# Patient Record
Sex: Female | Born: 2005 | Race: Black or African American | Hispanic: No | Marital: Single | Smoking: Never smoker
Health system: Southern US, Community
[De-identification: ages and names within clinical notes are randomized; demographics above are authoritative.]

## PROBLEM LIST (undated history)

## (undated) DIAGNOSIS — H6690 Otitis media, unspecified, unspecified ear: Secondary | ICD-10-CM

## (undated) HISTORY — DX: Otitis media, unspecified, unspecified ear: H66.90

## (undated) HISTORY — PX: TYMPANOSTOMY TUBE PLACEMENT: SHX32

---
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
## 2023-11-26 ENCOUNTER — Ambulatory Visit (HOSPITAL_COMMUNITY)
Admission: EM | Admit: 2023-11-26 | Discharge: 2023-11-27 | Disposition: A | Discharge status: Home / Self Care | Attending: Urology | Admitting: Urology

## 2023-11-26 DIAGNOSIS — F640 Transsexualism: Secondary | ICD-10-CM | POA: Insufficient documentation

## 2023-11-26 DIAGNOSIS — R45851 Suicidal ideations: Secondary | ICD-10-CM | POA: Diagnosis present

## 2023-11-26 DIAGNOSIS — F332 Major depressive disorder, recurrent severe without psychotic features: Secondary | ICD-10-CM | POA: Insufficient documentation

## 2023-11-26 DIAGNOSIS — F4323 Adjustment disorder with mixed anxiety and depressed mood: Secondary | ICD-10-CM | POA: Insufficient documentation

## 2023-11-26 DIAGNOSIS — F1010 Alcohol abuse, uncomplicated: Secondary | ICD-10-CM | POA: Diagnosis not present

## 2023-11-26 LAB — COMPREHENSIVE METABOLIC PANEL WITH GFR
ALT: 20 U/L (ref 0–44)
AST: 24 U/L (ref 15–41)
Albumin: 4.3 g/dL (ref 3.5–5.0)
Alkaline Phosphatase: 99 U/L (ref 38–126)
Anion gap: 10 (ref 5–15)
BUN: 10 mg/dL (ref 6–20)
CO2: 25 mmol/L (ref 22–32)
Calcium: 9.6 mg/dL (ref 8.9–10.3)
Chloride: 102 mmol/L (ref 98–111)
Creatinine, Ser: 0.99 mg/dL (ref 0.44–1.00)
GFR, Estimated: >60 mL/min (ref >60)
Glucose, Bld: 87 mg/dL (ref 70–99)
Potassium: 4.3 mmol/L (ref 3.5–5.1)
Sodium: 137 mmol/L (ref 135–145)
Total Bilirubin: 0.8 mg/dL (ref 0.0–1.2)
Total Protein: 7.4 g/dL (ref 6.5–8.1)

## 2023-11-26 LAB — CBC WITH DIFFERENTIAL/PLATELET
Abs Immature Granulocytes: 0.02 K/uL (ref 0.00–0.07)
Basophils Absolute: 0.1 K/uL (ref 0.0–0.1)
Basophils Relative: 1 %
Eosinophils Absolute: 0.1 K/uL (ref 0.0–0.5)
Eosinophils Relative: 2 %
HCT: 42.3 % (ref 36.0–46.0)
Hemoglobin: 13.8 g/dL (ref 12.0–15.0)
Immature Granulocytes: 0 %
Lymphocytes Relative: 46 %
Lymphs Abs: 3.3 K/uL (ref 0.7–4.0)
MCH: 30.5 pg (ref 26.0–34.0)
MCHC: 32.6 g/dL (ref 30.0–36.0)
MCV: 93.6 fL (ref 80.0–100.0)
Monocytes Absolute: 0.5 K/uL (ref 0.1–1.0)
Monocytes Relative: 7 %
Neutro Abs: 3.1 K/uL (ref 1.7–7.7)
Neutrophils Relative %: 44 %
Platelets: 326 K/uL (ref 150–400)
RBC: 4.52 MIL/uL (ref 3.87–5.11)
RDW: 12.2 % (ref 11.5–15.5)
WBC: 7 K/uL (ref 4.0–10.5)
nRBC: 0 % (ref 0.0–0.2)

## 2023-11-26 LAB — POCT URINE DRUG SCREEN - MANUAL ENTRY (I-SCREEN)
POC Amphetamine UR: NOT DETECTED
POC Buprenorphine (BUP): NOT DETECTED
POC Cocaine UR: NOT DETECTED
POC Marijuana UR: POSITIVE — AB
POC Methadone UR: NOT DETECTED
POC Methamphetamine UR: NOT DETECTED
POC Morphine: NOT DETECTED
POC Oxazepam (BZO): NOT DETECTED
POC Oxycodone UR: NOT DETECTED
POC Secobarbital (BAR): NOT DETECTED

## 2023-11-26 LAB — LIPID PANEL
Cholesterol: 154 mg/dL (ref 0–200)
HDL: 85 mg/dL (ref >40)
LDL Cholesterol: 60 mg/dL (ref 0–99)
Total CHOL/HDL Ratio: 1.8 ratio
Triglycerides: 44 mg/dL (ref <150)
VLDL: 9 mg/dL (ref 0–40)

## 2023-11-26 LAB — POC URINE PREG, ED: Preg Test, Ur: NEGATIVE

## 2023-11-26 LAB — HEMOGLOBIN A1C
Hgb A1c MFr Bld: 4.5 % — ABNORMAL LOW (ref 4.8–5.6)
Mean Plasma Glucose: 82.45 mg/dL

## 2023-11-26 LAB — TSH: TSH: 4.649 u[IU]/mL — ABNORMAL HIGH (ref 0.350–4.500)

## 2023-11-26 LAB — ETHANOL: Alcohol, Ethyl (B): <15 mg/dL (ref <15)

## 2023-11-26 MED ORDER — HALOPERIDOL LACTATE 5 MG/ML IJ SOLN: 10.0000 mg | Freq: Three times a day (TID) | INTRAMUSCULAR | Status: DC | PRN

## 2023-11-26 MED ORDER — DIPHENHYDRAMINE HCL 50 MG/ML IJ SOLN: 50.0000 mg | Freq: Three times a day (TID) | INTRAMUSCULAR | Status: DC | PRN

## 2023-11-26 MED ORDER — HALOPERIDOL 5 MG PO TABS: 5.0000 mg | ORAL_TABLET | Freq: Three times a day (TID) | ORAL | Status: DC | PRN

## 2023-11-26 MED ORDER — TRAZODONE HCL 50 MG PO TABS: 50.0000 mg | ORAL_TABLET | Freq: Every evening | ORAL | Status: DC | PRN

## 2023-11-26 MED ORDER — ACETAMINOPHEN 325 MG PO TABS: 650.0000 mg | ORAL_TABLET | Freq: Four times a day (QID) | ORAL | Status: DC | PRN

## 2023-11-26 MED ORDER — MAGNESIUM HYDROXIDE 400 MG/5ML PO SUSP: 30.0000 mL | Freq: Every day | ORAL | Status: DC | PRN

## 2023-11-26 MED ORDER — LORAZEPAM 2 MG/ML IJ SOLN: 2.0000 mg | Freq: Three times a day (TID) | INTRAMUSCULAR | Status: DC | PRN

## 2023-11-26 MED ORDER — DIPHENHYDRAMINE HCL 50 MG PO CAPS: 50.0000 mg | ORAL_CAPSULE | Freq: Three times a day (TID) | ORAL | Status: DC | PRN

## 2023-11-26 MED ORDER — ALUM & MAG HYDROXIDE-SIMETH 200-200-20 MG/5ML PO SUSP: 30.0000 mL | ORAL | Status: DC | PRN

## 2023-11-26 MED ORDER — HYDROXYZINE HCL 25 MG PO TABS: 25.0000 mg | ORAL_TABLET | Freq: Three times a day (TID) | ORAL | Status: DC | PRN

## 2023-11-26 MED ORDER — HALOPERIDOL LACTATE 5 MG/ML IJ SOLN: 5.0000 mg | Freq: Three times a day (TID) | INTRAMUSCULAR | Status: DC | PRN
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
## 2023-11-26 NOTE — ED Notes (Signed)
 Patient states he currently is anxious about being able to call out of work. Security contacted to get temporary get numbers from phone. Food/snacks offered. Patient oriented to bathroom and shower.

## 2023-11-26 NOTE — Progress Notes (Signed)
   11/26/23 1733  [facility] Triage Screening (Walk-ins at [facility] only)  How Did You Hear About Us ? Self  What Is the Reason for Your Visit/Call Today? Olivia Myers is a 18 year old female presenting to [facility] unaccompanied. Pt states he is having ongoing suicidal thoughts at this time. Pt reports that he has thought of a plan to end his life by abusing Adderall and Alcohol. Pt reports that he has no past suicide attempts. Pt states that he has been struggling with marijuana use and alcohol since he was 18 years old. Pt states that he has been drinking any chance he gets. Pt denies Hi and AVH. Pts appearance is neat, eye contact is normal, speech is normal, motor activity is normal and affect is full. Pt states he is not seeing a therapist or taking medication at this time. Pt denies having any mental health disorders.  How Long Has This Been Causing You Problems? <Week  Have You Recently Had Any Thoughts About Hurting Yourself? Yes  How long ago did you have thoughts about hurting yourself? today  Are You Planning to Commit Suicide/Harm Yourself At This time? Yes  Have you Recently Had Thoughts About Hurting Someone Sherral? No  Are You Planning To Harm Someone At This Time? No  Physical Abuse Denies  Verbal Abuse Denies  Sexual Abuse Denies  Exploitation of patient/patient's resources Denies  Self-Neglect Denies  Are you currently experiencing any auditory, visual or other hallucinations? No  Have You Used Any Alcohol or Drugs in the Past 24 Hours? Yes  What Did You Use and How Much? marijauna, alcohol  Do you have any current medical co-morbidities that require immediate attention? No  What Do You Feel Would Help You the Most Today? Treatment for Depression or other mood problem  If access to [facility] was not available, would you have sought care in the Emergency Department? No  Determination of Need Urgent (48 hours)  Options For Referral Inpatient Hospitalization  Determination of Need filed? Yes

## 2023-11-27 NOTE — Discharge Instructions (Addendum)
 Placed referral to be established for outpatient psychiatry follow up for medication management. The team will reach out to you regarding availability.  Follow-up recommendations:  Activity:  Normal, as tolerated Diet:  Per PCP recommendation  Patient is instructed prior to discharge to: Take all medications as prescribed by his mental healthcare provider. Report any adverse effects and/or reactions from the medicines to his outpatient provider promptly. Patient has been instructed & cautioned: To not engage in alcohol and or illegal drug use while on prescription medicines.  In the event of worsening symptoms, patient is instructed to call the crisis hotline at 988, 911 and or go to the nearest ED for appropriate evaluation and treatment of symptoms. To follow-up with his primary care provider for your other medical issues, concerns and or health care needs.

## 2023-11-27 NOTE — ED Notes (Signed)
 Stable. A&O x 4.  Discharging to home self care.   Denies current SI plan and Intent.  Denies HI and A/V hallucinations.   All belongings returned to PT.  F/U instruction reviewed   Pt verbalized understanding

## 2023-11-27 NOTE — ED Provider Notes (Signed)
 [facility] Urgent Care Continuous Assessment Admission H&P  Date: 11/27/23 Patient Name: Olivia Myers MRN: [id number] Chief Complaint: suicidal ideation   Diagnoses:  Final diagnoses:  Severe episode of recurrent major depressive disorder, without psychotic features (HCC)    HPI:  Olivia Myers is a 18 y.o. adult transgender female (prefers to be called Olivia Myers) with psychiatric history of gender dysphoria, adjustment disorder with depressed mood, and chronic suicidal ideation, who presented voluntarily to [facility] reporting suicidal ideation with a plan to overdose on Adderall and alcohol.    Patient seen face to face and his chart reviewed by this NP. On assessment, patient reports increased stress related to financial strain and his grandfather's deteriorating health. He says he lives with his grandparents; his grandmother is on disability, and his grandfather was recently diagnosed with early onset dementia. He says his driver's license his suspended and his father takes him to work. He expressed concern that his grandfather's worsening condition may force his grandfather to stop working and driving, further increasing the family's financial hardship. He reported feeling depressed and experiencing suicidal thoughts with plan to overdose on Adderall and alcohol. He denies any prior suicide attempts. He admitted to alcohol abuse, noting that he uses alcohol as a coping mechanism for his depression. He endorsed symptoms of hopelessness, social isolation, impulsivity, anxiety, and poor sleep.   patient is alert and oriented x4, dressed appropriately, and was cooperative during the interview. Eye contact was fair. Speech was clear, coherent and goal-directed. Mood was described as "depressed," and affect was constricted and congruent with mood. Thought processes were logical and organized, and thought content revealed suicidal ideation with a plan. He demonstrated good insight. Judgment is  impaired.   Total Time spent with patient: 30 minutes  Musculoskeletal  Strength & Muscle Tone: within normal limits Gait & Station: normal Patient leans: Right  Psychiatric Specialty Exam  Presentation General Appearance:  Appropriate for Environment  Eye Contact: Good  Speech: Clear and Coherent  Speech Volume: Normal  Handedness: Right   Mood and Affect  Mood: Depressed  Affect: Congruent   Thought Process  Thought Processes: Coherent  Descriptions of Associations:Intact  Orientation:Full (Time, Place and Person)  Thought Content:WDL  Diagnosis of Schizophrenia or Schizoaffective disorder in past: No   Hallucinations:Hallucinations: None  Ideas of Reference:None  Suicidal Thoughts:Suicidal Thoughts: Yes, Active SI Active Intent and/or Plan: With Plan; With Intent  Homicidal Thoughts:Homicidal Thoughts: No   Sensorium  Memory: Immediate Good  Judgment: Good  Insight: Good   Executive Functions  Concentration: Good  Attention Span: Good  Recall: Good  Fund of Knowledge: Good  Language: Good   Psychomotor Activity  Psychomotor Activity: Psychomotor Activity: Normal   Assets  Assets: Communication Skills; Desire for Improvement; Physical Health   Sleep  Sleep: Sleep: Fair Number of Hours of Sleep: 6   Nutritional Assessment (For OBS and FBC admissions only) Has the patient had a weight loss or gain of 10 pounds or more in the last 3 months?: No Has the patient had a decrease in food intake/or appetite?: No Does the patient have dental problems?: No Does the patient have eating habits or behaviors that may be indicators of an eating disorder including binging or inducing vomiting?: No Has the patient recently lost weight without trying?: 0 Has the patient been eating poorly because of a decreased appetite?: 0 Malnutrition Screening Tool Score: 0    Physical Exam Vitals and nursing note reviewed.   Constitutional:  General: He is not in acute distress.    Appearance: He is well-developed.  HENT:     Head: Normocephalic and atraumatic.  Eyes:     Conjunctiva/sclera: Conjunctivae normal.  Cardiovascular:     Rate and Rhythm: Normal rate.  Pulmonary:     Effort: Pulmonary effort is normal.  Abdominal:     Palpations: Abdomen is soft.  Musculoskeletal:        General: Normal range of motion.     Cervical back: Normal range of motion.  Skin:    General: Skin is warm and dry.     Capillary Refill: Capillary refill takes less than 2 seconds.  Neurological:     Mental Status: He is alert and oriented to person, place, and time.  Psychiatric:        Attention and Perception: Attention and perception normal.        Mood and Affect: Mood and affect normal.        Speech: Speech normal.        Behavior: Behavior normal. Behavior is cooperative.        Thought Content: Thought content includes suicidal ideation. Thought content includes suicidal plan.    Review of Systems  Constitutional: Negative.   HENT: Negative.    Eyes: Negative.   Respiratory: Negative.    Cardiovascular: Negative.   Gastrointestinal: Negative.   Genitourinary: Negative.   Musculoskeletal: Negative.   Skin: Negative.   Neurological: Negative.   Endo/Heme/Allergies: Negative.   Psychiatric/Behavioral:  Positive for depression, substance abuse and suicidal ideas.     Blood pressure 135/78, pulse 89, temperature 98.7 F (37.1 C), temperature source Oral, resp. rate 20, SpO2 99%. There is no height or weight on file to calculate BMI.  Past Psychiatric History: gender dysphoria, adjustment disorder with depressed mood, and chronic suicidal ideation   Is the patient at risk to self? Yes  Has the patient been a risk to self in the past 6 months? Yes .    Has the patient been a risk to self within the distant past? Yes   Is the patient a risk to others? No   Has the patient been a risk to others in  the past 6 months? No   Has the patient been a risk to others within the distant past? No   Past Medical History:  Past Medical History:  Diagnosis Date   Otitis media      Family History:  Family History  Problem Relation Age of Onset   Drug abuse Mother    Bipolar disorder Mother    Hyperlipidemia Maternal Grandmother    Diabetes Paternal Grandmother    Lupus Neg Hx      Social History:  Social History   Tobacco Use   Smoking status: Never    Passive exposure: Yes   Smokeless tobacco: Never  Vaping Use   Vaping status: Never Used  Substance Use Topics   Alcohol use: Yes    Comment: Had sips   Drug use: Yes    Types: Marijuana     Last Labs:  Admission on 11/26/2023  Component Date Value Ref Range Status   WBC 11/26/2023 7.0  4.0 - 10.5 K/uL Final   RBC 11/26/2023 4.52  3.87 - 5.11 MIL/uL Final   Hemoglobin 11/26/2023 13.8  12.0 - 15.0 g/dL Final   HCT 89/89/7974 42.3  36.0 - 46.0 % Final   MCV 11/26/2023 93.6  80.0 - 100.0 fL Final   MCH 11/26/2023 30.5  26.0 - 34.0 pg Final   MCHC 11/26/2023 32.6  30.0 - 36.0 g/dL Final   RDW 89/89/7974 12.2  11.5 - 15.5 % Final   Platelets 11/26/2023 326  150 - 400 K/uL Final   nRBC 11/26/2023 0.0  0.0 - 0.2 % Final   Neutrophils Relative % 11/26/2023 44  % Final   Neutro Abs 11/26/2023 3.1  1.7 - 7.7 K/uL Final   Lymphocytes Relative 11/26/2023 46  % Final   Lymphs Abs 11/26/2023 3.3  0.7 - 4.0 K/uL Final   Monocytes Relative 11/26/2023 7  % Final   Monocytes Absolute 11/26/2023 0.5  0.1 - 1.0 K/uL Final   Eosinophils Relative 11/26/2023 2  % Final   Eosinophils Absolute 11/26/2023 0.1  0.0 - 0.5 K/uL Final   Basophils Relative 11/26/2023 1  % Final   Basophils Absolute 11/26/2023 0.1  0.0 - 0.1 K/uL Final   Immature Granulocytes 11/26/2023 0  % Final   Abs Immature Granulocytes 11/26/2023 0.02  0.00 - 0.07 K/uL Final   Performed at [facility], [address], [place], [place] [address]   Hgb A1c MFr Bld  11/26/2023 4.5 (L)  4.8 - 5.6 % Final   Comment: (NOTE) Diagnosis of Diabetes The following HbA1c ranges recommended by the American Diabetes Association (ADA) may be used as an aid in the diagnosis of diabetes mellitus.  Hemoglobin             Suggested A1C NGSP%              Diagnosis  <5.7                   Non Diabetic  5.7-6.4                Pre-Diabetic  >6.4                   Diabetic  <7.0                   Glycemic control for                       adults with diabetes.     Mean Plasma Glucose 11/26/2023 82.45  mg/dL Final   Performed at [facility], [address], [place], [place] [address]   Sodium 11/26/2023 137  135 - 145 mmol/L Final   Potassium 11/26/2023 4.3  3.5 - 5.1 mmol/L Final   Chloride 11/26/2023 102  98 - 111 mmol/L Final   CO2 11/26/2023 25  22 - 32 mmol/L Final   Glucose, Bld 11/26/2023 87  70 - 99 mg/dL Final   Glucose reference range applies only to samples taken after fasting for at least 8 hours.   BUN 11/26/2023 10  6 - 20 mg/dL Final   Creatinine, Ser 11/26/2023 0.99  0.44 - 1.00 mg/dL Final   Calcium 89/89/7974 9.6  8.9 - 10.3 mg/dL Final   Total Protein 89/89/7974 7.4  6.5 - 8.1 g/dL Final   Albumin 89/89/7974 4.3  3.5 - 5.0 g/dL Final   AST 89/89/7974 24  15 - 41 U/L Final   ALT 11/26/2023 20  0 - 44 U/L Final   Alkaline Phosphatase 11/26/2023 99  38 - 126 U/L Final   Total Bilirubin 11/26/2023 0.8  0.0 - 1.2 mg/dL Final   GFR, Estimated 11/26/2023 >60  >60 mL/min Final   Comment: (NOTE) Calculated using the CKD-EPI Creatinine  Equation (2021)    Anion gap 11/26/2023 10  5 - 15 Final   Performed at [facility], [address] [place], [place] [address]   Alcohol, Ethyl (B) 11/26/2023 <15  <15 mg/dL Final   Comment: (NOTE) For medical purposes only. Performed at [facility], [address], [place], [place] [address]    Cholesterol 11/26/2023 154  0 - 200 mg/dL Final   Triglycerides 89/89/7974 44  <150 mg/dL  Final   HDL 89/89/7974 85  >40 mg/dL Final   Total CHOL/HDL Ratio 11/26/2023 1.8  RATIO Final   VLDL 11/26/2023 9  0 - 40 mg/dL Final   LDL Cholesterol 11/26/2023 60  0 - 99 mg/dL Final   Comment:        Total Cholesterol/HDL:CHD Risk Coronary Heart Disease Risk Table                     Men   Women  1/2 Average Risk   3.4   3.3  Average Risk       5.0   4.4  2 X Average Risk   9.6   7.1  3 X Average Risk  23.4   11.0        Use the calculated Patient Ratio above and the CHD Risk Table to determine the patient's CHD Risk.        ATP III CLASSIFICATION (LDL):  <100     mg/dL   Optimal  899-870  mg/dL   Near or Above                    Optimal  130-159  mg/dL   Borderline  839-810  mg/dL   High  >809     mg/dL   Very High Performed at [facility], [address], [place], [place] [address]    Preg Test, Ur 11/26/2023 Negative  Negative Final   POC Amphetamine  UR 11/26/2023 None Detected  NONE DETECTED (Cut Off Level 1000 ng/mL) Final   POC Secobarbital (BAR) 11/26/2023 None Detected  NONE DETECTED (Cut Off Level 300 ng/mL) Final   POC Buprenorphine (BUP) 11/26/2023 None Detected  NONE DETECTED (Cut Off Level 10 ng/mL) Final   POC Oxazepam (BZO) 11/26/2023 None Detected  NONE DETECTED (Cut Off Level 300 ng/mL) Final   POC Cocaine UR 11/26/2023 None Detected  NONE DETECTED (Cut Off Level 300 ng/mL) Final   POC Methamphetamine UR 11/26/2023 None Detected  NONE DETECTED (Cut Off Level 1000 ng/mL) Final   POC Morphine 11/26/2023 None Detected  NONE DETECTED (Cut Off Level 300 ng/mL) Final   POC Methadone UR 11/26/2023 None Detected  NONE DETECTED (Cut Off Level 300 ng/mL) Final   POC Oxycodone UR 11/26/2023 None Detected  NONE DETECTED (Cut Off Level 100 ng/mL) Final   POC Marijuana UR 11/26/2023 Positive (A)  NONE DETECTED (Cut Off Level 50 ng/mL) Final   TSH 11/26/2023 4.649 (H)  0.350 - 4.500 uIU/mL Final   Comment: Performed by a 3rd Generation assay with a functional  sensitivity of <=0.01 uIU/mL. Performed at [facility], [address] [place], [place] [address]     Allergies: Patient has no known allergies.  Medications:  Facility Ordered Medications  Medication   acetaminophen (TYLENOL) tablet 650 mg   alum & mag hydroxide-simeth (MAALOX/MYLANTA) 200-200-20 MG/5ML suspension 30 mL   magnesium hydroxide (MILK OF MAGNESIA) suspension 30 mL   haloperidol (HALDOL) tablet 5 mg  And   diphenhydrAMINE (BENADRYL) capsule 50 mg   haloperidol lactate (HALDOL) injection 5 mg   And   diphenhydrAMINE (BENADRYL) injection 50 mg   And   LORazepam (ATIVAN) injection 2 mg   haloperidol lactate (HALDOL) injection 10 mg   And   diphenhydrAMINE (BENADRYL) injection 50 mg   And   LORazepam (ATIVAN) injection 2 mg   traZODone (DESYREL) tablet 50 mg   hydrOXYzine  (ATARAX ) tablet 25 mg   PTA Medications  Medication Sig   TUBERCULIN SYR 1CC/27GX1/2 (B-D TB SYRINGE 1CC/27GX1/2) 27G X 1/2 1 ML MISC USE 1 SYRINGE AND NEEDLE WEEKLY FOR TESTOSTERONE  INJECTION   NEEDLE, DISP, 25 G 25G X 5/8 MISC Use 1 needle weekly for subcutaneous testosterone  injection   testosterone  cypionate (DEPOTESTOSTERONE CYPIONATE) 200 MG/ML injection Inject 0.38 mLs (76 mg total) into the skin once a week.      Medical Decision Making  Patient presents with active suicidal ideation with plan and access to means. Due to safety risk, inpatient psychiatric admission was recommended for safety and stabilization however patient expressed that he would like to wait until the morning for reassessment to determine if symptoms have improved before finalizing treatment decisions.    Recommendations  Based on my evaluation the patient does not appear to have an emergency medical condition.  Kathryne DELENA Show, NP 11/27/23  6:58 AM

## 2023-11-27 NOTE — ED Provider Notes (Signed)
 FBC/OBS ASAP Discharge Summary  Date and Time: 11/27/2023 10:20 AM  Name: Olivia Myers  MRN:  [id number]   Discharge Diagnoses:  Final diagnoses:  Severe episode of recurrent major depressive disorder, without psychotic features (HCC)    Subjective: History of Present illness: Olivia Myers is a 18 y.o. adult transgender female (prefers to be called Olivia Myers) with psychiatric history of gender dysphoria, adjustment disorder with depressed mood, and chronic suicidal ideation, who presented voluntarily to [facility] reporting suicidal ideation with a plan to overdose on Adderall and alcohol.   This morning, patient reports feeling better compared to yesterday.  He states that he is no longer feeling suicidal and he contracts for safety.  He states that yesterday he had thoughts of suicide with a plan to overdose on his Adderall and drink alcohol.  He states that he does not currently have his Adderall at home as he has not picked up his recent prescription and this is confirmed through the medication dispense history.  He states that he was having worsening depression due to his job and he plans on transferring departments to aid with the stress. We discussed a safety plan including warning signs, coping strategies, and possible people to contact and resources to utilize if SI worsen.  Patient does report frequent alcohol use and would benefit from follow-up appointment to discuss medication management and substance use treatment.  Patient states that he also has a follow-up appointment with his PCP Lauraine Patient on Friday which this is confirmed via the encounters.  Discussed that I will place a referral for outpatient psychiatric clinic and the team will be in contact with him to discuss availability for the intake appointment and he is in agreement.  Collateral Call I spoke with patient's grandmother and legal mother Santana Pacini. Confirmed with her that patient is safe to discharge home. She  confirms there are no illicit substances or weapons in the home.   Stay Summary: The patient was evaluated each day by a clinical provider to ascertain response to treatment. Improvement was noted by the patient's report of decreasing symptoms, improved sleep and appetite, affect, medication tolerance, behavior, and participation in unit programming.  Patient was asked each day to complete a self inventory noting mood, mental status, pain, new symptoms, anxiety and concerns.  Patient responded well to medication and being in a therapeutic and supportive environment. Positive and appropriate behavior was noted and the patient was motivated for recovery. The patient worked closely with the treatment team and case manager to develop a discharge plan with appropriate goals. Coping skills, problem solving as well as relaxation therapies were also part of the unit programming.    Total Time spent with patient: 30 minutes  Past Psychiatric History: Gender dysphoria, MDD Previously prescribed Wellbutrin , Lexapro , and Zoloft  but seem to have noncompliance for the medications and felt they were ineffective Past Medical History: Currently on testosterone  Family Psychiatric History: Mother-bipolar disorder Social History: Currently employed, lives with his grandparents whom his grandmother is his legal mother Tobacco Cessation:  N/A, patient does not currently use tobacco products  Current Medications:  Current Facility-Administered Medications  Medication Dose Route Frequency Provider Last Rate Last Admin   acetaminophen (TYLENOL) tablet 650 mg  650 mg Oral Q6H PRN Ajibola, Ene A, NP       alum & mag hydroxide-simeth (MAALOX/MYLANTA) 200-200-20 MG/5ML suspension 30 mL  30 mL Oral Q4H PRN Ajibola, Ene A, NP       haloperidol (HALDOL) tablet 5  mg  5 mg Oral TID PRN Ajibola, Ene A, NP       And   diphenhydrAMINE (BENADRYL) capsule 50 mg  50 mg Oral TID PRN Ajibola, Ene A, NP       haloperidol lactate  (HALDOL) injection 5 mg  5 mg Intramuscular TID PRN Ajibola, Ene A, NP       And   diphenhydrAMINE (BENADRYL) injection 50 mg  50 mg Intramuscular TID PRN Ajibola, Ene A, NP       And   LORazepam (ATIVAN) injection 2 mg  2 mg Intramuscular TID PRN Ajibola, Ene A, NP       haloperidol lactate (HALDOL) injection 10 mg  10 mg Intramuscular TID PRN Ajibola, Ene A, NP       And   diphenhydrAMINE (BENADRYL) injection 50 mg  50 mg Intramuscular TID PRN Ajibola, Ene A, NP       And   LORazepam (ATIVAN) injection 2 mg  2 mg Intramuscular TID PRN Ajibola, Ene A, NP       hydrOXYzine  (ATARAX ) tablet 25 mg  25 mg Oral TID PRN Ajibola, Ene A, NP       magnesium hydroxide (MILK OF MAGNESIA) suspension 30 mL  30 mL Oral Daily PRN Ajibola, Ene A, NP       traZODone (DESYREL) tablet 50 mg  50 mg Oral QHS PRN Ajibola, Ene A, NP       Current Outpatient Medications  Medication Sig Dispense Refill   amphetamine -dextroamphetamine (ADDERALL XR) 10 MG 24 hr capsule Take 10 mg by mouth daily.     NEEDLE, DISP, 25 G 25G X 5/8 MISC Use 1 needle weekly for subcutaneous testosterone  injection 15 each 6   testosterone  cypionate (DEPOTESTOSTERONE CYPIONATE) 200 MG/ML injection Inject 0.38 mLs (76 mg total) into the skin once a week. 10 mL 0    PTA Medications:  Facility Ordered Medications  Medication   acetaminophen (TYLENOL) tablet 650 mg   alum & mag hydroxide-simeth (MAALOX/MYLANTA) 200-200-20 MG/5ML suspension 30 mL   magnesium hydroxide (MILK OF MAGNESIA) suspension 30 mL   haloperidol (HALDOL) tablet 5 mg   And   diphenhydrAMINE (BENADRYL) capsule 50 mg   haloperidol lactate (HALDOL) injection 5 mg   And   diphenhydrAMINE (BENADRYL) injection 50 mg   And   LORazepam (ATIVAN) injection 2 mg   haloperidol lactate (HALDOL) injection 10 mg   And   diphenhydrAMINE (BENADRYL) injection 50 mg   And   LORazepam (ATIVAN) injection 2 mg   traZODone (DESYREL) tablet 50 mg   hydrOXYzine  (ATARAX ) tablet 25 mg    PTA Medications  Medication Sig   NEEDLE, DISP, 25 G 25G X 5/8 MISC Use 1 needle weekly for subcutaneous testosterone  injection   testosterone  cypionate (DEPOTESTOSTERONE CYPIONATE) 200 MG/ML injection Inject 0.38 mLs (76 mg total) into the skin once a week.   amphetamine -dextroamphetamine (ADDERALL XR) 10 MG 24 hr capsule Take 10 mg by mouth daily.       04/24/2021    9:36 AM 11/21/2020   10:05 AM 11/06/2020    4:31 PM  Depression screen PHQ 2/9  Decreased Interest 0 2 1  Down, Depressed, Hopeless 0 0 1  PHQ - 2 Score 0 2 2  Altered sleeping 0 0 0  Tired, decreased energy 0 1 1  Change in appetite 1 1 1   Feeling bad or failure about yourself  0 0 1  Trouble concentrating 1 1 1   Moving slowly or fidgety/restless  0 0 1  PHQ-9 Score 2 5 7     Flowsheet Row ED from 11/26/2023 in [facility] ED from [facility]/28/2020 in [facility]  C-SSRS RISK CATEGORY High Risk High Risk    Musculoskeletal  Strength & Muscle Tone: within normal limits Gait & Station: normal Patient leans: N/A  Psychiatric Specialty Exam  Presentation  General Appearance:  Appropriate for Environment; Fairly Groomed  Eye Contact: Fair  Speech: Clear and Coherent; Normal Rate  Speech Volume: Normal  Handedness: Right   Mood and Affect  Mood: Anxious  Affect: Congruent   Thought Process  Thought Processes: Coherent; Linear  Descriptions of Associations:Intact  Orientation:Full (Time, Place and Person)  Thought Content:Logical; WDL  Diagnosis of Schizophrenia or Schizoaffective disorder in past: No    Hallucinations:Hallucinations: None  Ideas of Reference:None  Suicidal Thoughts:Suicidal Thoughts: No  Homicidal Thoughts:Homicidal Thoughts: No   Sensorium  Memory: Remote Good  Judgment: Fair  Insight: Fair   Art therapist  Concentration: Good  Attention Span: Good  Recall: Good  Fund of  Knowledge: Good  Language: Good   Psychomotor Activity  Psychomotor Activity: Psychomotor Activity: Normal   Assets  Assets: Communication Skills; Resilience; Desire for Improvement; Social Support; Transportation   Sleep  Sleep: Sleep: Fair  No Safety Checks orders active in given range  Nutritional Assessment (For OBS and [facility] admissions only) Has the patient had a weight loss or gain of 10 pounds or more in the last 3 months?: No Has the patient had a decrease in food intake/or appetite?: No Does the patient have dental problems?: No Does the patient have eating habits or behaviors that may be indicators of an eating disorder including binging or inducing vomiting?: No Has the patient recently lost weight without trying?: 0 Has the patient been eating poorly because of a decreased appetite?: 0 Malnutrition Screening Tool Score: 0    Physical Exam  Physical Exam Vitals reviewed.  Constitutional:      Appearance: Normal appearance.  HENT:     Head: Normocephalic and atraumatic.  Cardiovascular:     Rate and Rhythm: Normal rate.  Pulmonary:     Effort: Pulmonary effort is normal.  Neurological:     General: No focal deficit present.     Mental Status: He is alert and oriented to person, place, and time.    Review of Systems  Constitutional:  Negative for chills and fever.  Respiratory:  Negative for shortness of breath.   Cardiovascular:  Negative for chest pain and palpitations.  Gastrointestinal:  Negative for nausea and vomiting.  Neurological:  Negative for headaches.   Blood pressure (!) 129/91, pulse 83, temperature 98 F (36.7 C), temperature source Oral, resp. rate 17, SpO2 100%. There is no height or weight on file to calculate BMI.  Demographic Factors:  Adolescent or young adult and Gay, lesbian, or bisexual orientation  Loss Factors: NA  Historical Factors: Impulsivity  Risk Reduction Factors:   Employed, Living with another person,  especially a relative, and Positive social support  Continued Clinical Symptoms:  Depression:   Anhedonia Insomnia  Cognitive Features That Contribute To Risk:  Closed-mindedness    Suicide Risk:  Mild:  Suicidal ideation of limited frequency, intensity, duration, and specificity.  There are no identifiable plans, no associated intent, mild dysphoria and related symptoms, good self-control (both objective and subjective assessment), few other risk factors, and identifiable protective factors, including available and accessible social support.  Plan Of Care/Follow-up recommendations:  Activity as tolerated Regular diet Continue prescription medications See PCP for medical conditions   Disposition: home with referral to OP psychiatric clinic  Ismael KATHEE Franco, MD 11/27/2023, 10:20 AM

## 2023-11-27 NOTE — ED Notes (Signed)
 Pt  presented sitting in recliner,  calm upon approach.  Blunted affect.  Pt denied SI at present and contracted for safety should thoughts return. Pt reports that he feels better this am. As the situation that had upset him at work, is passed.   The manager that he had an issue with, he will not have to deal with them because manager won't be there for the next few days.  Pt states he feel like he wants to go home Q 15 minute observations for safety continue
